# Patient Record
Sex: Female | Born: 1990 | Race: Black or African American | Hispanic: No | Smoking: Never smoker
Health system: Southern US, Community
[De-identification: ages and names within clinical notes are randomized; demographics above are authoritative.]

## PROBLEM LIST (undated history)

## (undated) DIAGNOSIS — E282 Polycystic ovarian syndrome: Secondary | ICD-10-CM

---

## 2014-10-28 ENCOUNTER — Encounter (HOSPITAL_BASED_OUTPATIENT_CLINIC_OR_DEPARTMENT_OTHER): Payer: Self-pay | Admitting: Emergency Medicine

## 2014-10-28 ENCOUNTER — Emergency Department (HOSPITAL_BASED_OUTPATIENT_CLINIC_OR_DEPARTMENT_OTHER)
Admission: EM | Admit: 2014-10-28 | Discharge: 2014-10-28 | Disposition: A | Discharge status: Home / Self Care | Payer: Managed Care, Other (non HMO) | Attending: Emergency Medicine | Admitting: Emergency Medicine

## 2014-10-28 DIAGNOSIS — K0889 Other specified disorders of teeth and supporting structures: Secondary | ICD-10-CM

## 2014-10-28 DIAGNOSIS — K088 Other specified disorders of teeth and supporting structures: Secondary | ICD-10-CM | POA: Insufficient documentation

## 2014-10-28 DIAGNOSIS — Z792 Long term (current) use of antibiotics: Secondary | ICD-10-CM | POA: Insufficient documentation

## 2014-10-28 DIAGNOSIS — K047 Periapical abscess without sinus: Secondary | ICD-10-CM | POA: Insufficient documentation

## 2014-10-28 MED ORDER — HYDROCODONE-ACETAMINOPHEN 5-325 MG PO TABS: 1.0000 | ORAL_TABLET | Freq: Four times a day (QID) | ORAL | Status: DC | PRN

## 2014-10-28 NOTE — ED Notes (Signed)
Patient states that she is on PCN and Motrin after seeing her Dentist for an "infected tooth". The patient states that the pain is so bad tonight that she thinks it needs to be "cut out"

## 2014-10-28 NOTE — ED Provider Notes (Signed)
CSN: [id number]     Arrival date & time 10/28/14  2258 History  This chart was scribed for Geoffery Lyons, MD by Bronson Curb, ED Scribe. This patient was seen in room MH02/MH02 and the patient's care was started at 11:37 PM.   Chief Complaint  Patient presents with  . Dental Pain    The history is provided by the patient. No language interpreter was used.     HPI Comments: Amber Bryan is a 24 y.o. female, with no significant past medical history, who presents to the Emergency Department complaining of intermittent, throbbing, right upper dental pain that began today. Patient was recently seen by her dentist and was informed she had an infection in her tooth. She has been taking penicillin and Motrin as prescribed without significant improvement. She is has also tried anesthetic gel without relief. She denies fever or drainage.   History reviewed. No pertinent past medical history. History reviewed. No pertinent past surgical history. History reviewed. No pertinent family history. History  Substance Use Topics  . Smoking status: Never Smoker   . Smokeless tobacco: Not on file  . Alcohol Use: No   OB History    No data available     Review of Systems  A complete 10 system review of systems was obtained and all systems are negative except as noted in the HPI and PMH.    Allergies  Review of patient's allergies indicates no known allergies.  Home Medications   Prior to Admission medications   Medication Sig Start Date End Date Taking? Authorizing Provider  ibuprofen (ADVIL,MOTRIN) 800 MG tablet Take 800 mg by mouth every 8 (eight) hours as needed.   Yes Historical Provider, MD  penicillin v potassium (VEETID) 250 MG tablet Take 250 mg by mouth 4 (four) times daily.   Yes Historical Provider, MD   Triage Vitals: BP 124/72 mmHg  Pulse 79  Temp(Src) 98.6 F (37 C) (Oral)  Resp 18  Wt 250 lb (113.399 kg)  SpO2 100%  LMP 09/15/2014 (Approximate)  Physical Exam   Constitutional: She is oriented to person, place, and time. She appears well-developed and well-nourished. No distress.  HENT:  Head: Normocephalic and atraumatic.  Eyes: Conjunctivae and EOM are normal.  Neck: Neck supple. No tracheal deviation present.  Cardiovascular: Normal rate.   Pulmonary/Chest: Effort normal. No respiratory distress.  Musculoskeletal: Normal range of motion.  Neurological: She is alert and oriented to person, place, and time.  Skin: Skin is warm and dry.  Psychiatric: She has a normal mood and affect. Her behavior is normal.  Nursing note and vitals reviewed.   ED Course  Procedures (including critical care time)  DIAGNOSTIC STUDIES: Oxygen Saturation is 100% on room air, normal by my interpretation.    COORDINATION OF CARE: At 2340 Discussed treatment plan with patient which includes pain medication and f/u with dentist. Patient agrees.   Labs Review Labs Reviewed - No data to display  Imaging Review No results found.   EKG Interpretation None      MDM   Final diagnoses:  None    Patient presents with complaints of dental pain. She is on penicillin and ibuprofen, however her pain worsened this evening. Nothing on her exam suggests an abscess or an emergent condition. I will give her pain medication and advised her to follow-up with her dentist on Monday.  I personally performed the services described in this documentation, which was scribed in my presence. The recorded information has been reviewed and  is accurate.      Geoffery Lyons, MD 10/29/14 [phone]

## 2014-10-28 NOTE — Discharge Instructions (Signed)
Continue penicillin as prescribed.  Hydrocodone as prescribed as needed for pain.  Follow-up with your dentist on Monday if not improving.   Dental Pain A tooth ache may be caused by cavities (tooth decay). Cavities expose the nerve of the tooth to air and hot or cold temperatures. It may come from an infection or abscess (also called a boil or furuncle) around your tooth. It is also often caused by dental caries (tooth decay). This causes the pain you are having. DIAGNOSIS  Your caregiver can diagnose this problem by exam. TREATMENT   If caused by an infection, it may be treated with medications which kill germs (antibiotics) and pain medications as prescribed by your caregiver. Take medications as directed.  Only take over-the-counter or prescription medicines for pain, discomfort, or fever as directed by your caregiver.  Whether the tooth ache today is caused by infection or dental disease, you should see your dentist as soon as possible for further care. SEEK MEDICAL CARE IF: The exam and treatment you received today has been provided on an emergency basis only. This is not a substitute for complete medical or dental care. If your problem worsens or new problems (symptoms) appear, and you are unable to meet with your dentist, call or return to this location. SEEK IMMEDIATE MEDICAL CARE IF:   You have a fever.  You develop redness and swelling of your face, jaw, or neck.  You are unable to open your mouth.  You have severe pain uncontrolled by pain medicine. MAKE SURE YOU:   Understand these instructions.  Will watch your condition.  Will get help right away if you are not doing well or get worse. Document Released: 04/29/2005 Document Revised: 07/22/2011 Document Reviewed: 12/16/2007 [facility] Patient Information 2015 [place], [place]. This information is not intended to replace advice given to you by your health care provider. Make sure you discuss any questions you have with  your health care provider.

## 2014-10-28 NOTE — ED Notes (Signed)
Rt upper tooth pain x 1 day

## 2016-08-19 ENCOUNTER — Encounter (HOSPITAL_COMMUNITY): Payer: Self-pay

## 2016-08-19 ENCOUNTER — Emergency Department (HOSPITAL_COMMUNITY)
Admission: EM | Admit: 2016-08-19 | Discharge: 2016-08-19 | Disposition: A | Discharge status: Home / Self Care | Payer: No Typology Code available for payment source | Attending: Emergency Medicine | Admitting: Emergency Medicine

## 2016-08-19 DIAGNOSIS — Z79899 Other long term (current) drug therapy: Secondary | ICD-10-CM | POA: Insufficient documentation

## 2016-08-19 DIAGNOSIS — R51 Headache: Secondary | ICD-10-CM | POA: Diagnosis present

## 2016-08-19 DIAGNOSIS — R42 Dizziness and giddiness: Secondary | ICD-10-CM

## 2016-08-19 DIAGNOSIS — G43909 Migraine, unspecified, not intractable, without status migrainosus: Secondary | ICD-10-CM | POA: Diagnosis not present

## 2016-08-19 LAB — CBC
HCT: 38.6 % (ref 36.0–46.0)
Hemoglobin: 12.9 g/dL (ref 12.0–15.0)
MCH: 29.9 pg (ref 26.0–34.0)
MCHC: 33.4 g/dL (ref 30.0–36.0)
MCV: 89.4 fL (ref 78.0–100.0)
PLATELETS: 355 10*3/uL (ref 150–400)
RBC: 4.32 MIL/uL (ref 3.87–5.11)
RDW: 13.2 % (ref 11.5–15.5)
WBC: 8.4 10*3/uL (ref 4.0–10.5)

## 2016-08-19 LAB — COMPREHENSIVE METABOLIC PANEL
ALT: 17 U/L (ref 14–54)
ANION GAP: 5 (ref 5–15)
AST: 19 U/L (ref 15–41)
Albumin: 4 g/dL (ref 3.5–5.0)
Alkaline Phosphatase: 78 U/L (ref 38–126)
BILIRUBIN TOTAL: 0.6 mg/dL (ref 0.3–1.2)
BUN: 13 mg/dL (ref 6–20)
CHLORIDE: 109 mmol/L (ref 101–111)
CO2: 26 mmol/L (ref 22–32)
Calcium: 9.8 mg/dL (ref 8.9–10.3)
Creatinine, Ser: 0.9 mg/dL (ref 0.44–1.00)
GFR calc Af Amer: >60 mL/min (ref >60)
GFR calc non Af Amer: >60 mL/min (ref >60)
GLUCOSE: 82 mg/dL (ref 65–99)
POTASSIUM: 4.3 mmol/L (ref 3.5–5.1)
Sodium: 140 mmol/L (ref 135–145)
Total Protein: 8.3 g/dL — ABNORMAL HIGH (ref 6.5–8.1)

## 2016-08-19 LAB — I-STAT BETA HCG BLOOD, ED (MC, WL, AP ONLY)

## 2016-08-19 LAB — URINALYSIS, ROUTINE W REFLEX MICROSCOPIC
Bilirubin Urine: NEGATIVE
GLUCOSE, UA: NEGATIVE mg/dL
Hgb urine dipstick: NEGATIVE
Ketones, ur: NEGATIVE mg/dL
Nitrite: NEGATIVE
PH: 6 (ref 5.0–8.0)
Protein, ur: NEGATIVE mg/dL
Specific Gravity, Urine: 1.023 (ref 1.005–1.030)

## 2016-08-19 MED ORDER — PROCHLORPERAZINE EDISYLATE 5 MG/ML IJ SOLN
10.0000 mg | Freq: Once | INTRAMUSCULAR | Status: AC
  Administered 2016-08-19: 10 mg via INTRAVENOUS
  Filled 2016-08-19: qty 2

## 2016-08-19 MED ORDER — DIPHENHYDRAMINE HCL 50 MG/ML IJ SOLN
25.0000 mg | Freq: Once | INTRAMUSCULAR | Status: AC
  Administered 2016-08-19: 25 mg via INTRAVENOUS
  Filled 2016-08-19: qty 1

## 2016-08-19 MED ORDER — MECLIZINE HCL 25 MG PO TABS: 25.0000 mg | ORAL_TABLET | Freq: Three times a day (TID) | ORAL | 0 refills | Status: DC | PRN

## 2016-08-19 MED ORDER — SODIUM CHLORIDE 0.9 % IV BOLUS (SEPSIS)
1000.0000 mL | Freq: Once | INTRAVENOUS | Status: AC
  Administered 2016-08-19: 1000 mL via INTRAVENOUS

## 2016-08-19 MED ORDER — KETOROLAC TROMETHAMINE 30 MG/ML IJ SOLN
30.0000 mg | Freq: Once | INTRAMUSCULAR | Status: AC
  Administered 2016-08-19: 30 mg via INTRAVENOUS
  Filled 2016-08-19: qty 1

## 2016-08-19 NOTE — Discharge Instructions (Signed)
Read the information below.  Use the prescribed medication as directed.  Please discuss all new medications with your pharmacist.  You may return to the Emergency Department at any time for worsening condition or any new symptoms that concern you.    °

## 2016-08-19 NOTE — ED Triage Notes (Signed)
PT C/O DIZZINESS X4 DAYS AGO, AND HEADACHE SINCE TODAY. PT HAD NAUSEA, BUT FOR ONLY 2 DAYS. DENIES VOMITING. SENSITIVITY TO LIGHT, AS WELL.

## 2016-08-19 NOTE — ED Provider Notes (Signed)
[facility]PT Provider Note   CSN: [id number] Arrival date & time: 08/19/16  1511     History   Chief Complaint Chief Complaint  Patient presents with  . Dizziness  . Headache    HPI Amber Bryan is a 26 y.o. female.  HPI   Patient presents with 4 days of dizziness, spinning and lightheadedness, that is intermittent, had nausea two days ago that came and went, headache today that is right side 6/10 intensity.  Pt states the headache has improved and is mild currently.  Denies fevers, chills, myalgias, head trauma, recent illness, neck stiffness.  No visual changes.  No focal neurologic deficits.  Has PCOS, doubts pregnancy.    History reviewed. No pertinent past medical history.  There are no active problems to display for this patient.   History reviewed. No pertinent surgical history.  OB History    No data available       Home Medications    Prior to Admission medications   Medication Sig Start Date End Date Taking? Authorizing Provider  medroxyPROGESTERone (PROVERA) 10 MG tablet Take 10 mg by mouth daily.   Yes Historical Provider, MD  metFORMIN (GLUCOPHAGE) 500 MG tablet Take 500 mg by mouth 2 (two) times daily with a meal.   Yes Historical Provider, MD  meclizine (ANTIVERT) 25 MG tablet Take 1 tablet (25 mg total) by mouth 3 (three) times daily as needed for dizziness. 08/19/16   Trixie Dredge, PA-C    Family History History reviewed. No pertinent family history.  Social History Social History  Substance Use Topics  . Smoking status: Never Smoker  . Smokeless tobacco: Never Used  . Alcohol use No     Allergies   Patient has no known allergies.   Review of Systems Review of Systems  All other systems reviewed and are negative.    Physical Exam Updated Vital Signs BP 120/68   Pulse 80   Temp 99.2 F (37.3 C) (Oral)   Resp 16   Ht  (1.676 m)   Wt (!) 149.7 kg   LMP 05/28/2016   SpO2 96%   BMI 53.26 kg/m   Physical Exam    Constitutional: She appears well-developed and well-nourished. No distress.  HENT:  Head: Normocephalic and atraumatic.  Neck: Neck supple.  Cardiovascular: Normal rate and regular rhythm.   Pulmonary/Chest: Effort normal and breath sounds normal. No respiratory distress. She has no wheezes. She has no rales.  Abdominal: Soft. She exhibits no distension. There is no tenderness. There is no rebound and no guarding.  Neurological: She is alert.  CN II-XII intact, EOMs intact, no pronator drift, grip strengths equal bilaterally; strength 5/5 in all extremities, sensation intact in all extremities; finger to nose, heel to shin, rapid alternating movements normal; gait is normal.     Skin: She is not diaphoretic.  Nursing note and vitals reviewed.    ED Treatments / Results  Labs (all labs ordered are listed, but only abnormal results are displayed) Labs Reviewed  URINALYSIS, ROUTINE W REFLEX MICROSCOPIC - Abnormal; Notable for the following:       Result Value   APPearance HAZY (*)    Leukocytes, UA MODERATE (*)    Bacteria, UA RARE (*)    Squamous Epithelial / LPF 6-30 (*)    All other components within normal limits  COMPREHENSIVE METABOLIC PANEL - Abnormal; Notable for the following:    Total Protein 8.3 (*)    All other components within normal limits  CBC  I-STAT BETA HCG BLOOD, ED ([facility], [facility], [facility] ONLY)    EKG  EKG Interpretation None       Radiology No results found.  Procedures Procedures (including critical care time)  Medications Ordered in ED Medications  ketorolac (TORADOL) 30 MG/ML injection 30 mg (30 mg Intravenous Given 08/19/16 1720)  prochlorperazine (COMPAZINE) injection 10 mg (10 mg Intravenous Given 08/19/16 1720)  diphenhydrAMINE (BENADRYL) injection 25 mg (25 mg Intravenous Given 08/19/16 1720)  sodium chloride 0.9 % bolus 1,000 mL (0 mLs Intravenous Stopped 08/19/16 1809)     Initial Impression / Assessment and Plan / ED Course  I have reviewed the  triage vital signs and the nursing notes.  Pertinent labs & imaging results that were available during my care of the patient were reviewed by me and considered in my medical decision making (see chart for details).     Afebrile, nontoxic patient with 4 days of intermittent dizziness (spinning, lightheadedness), asymptomatic from this while in the ED, right sided headache that resolved with migraine cocktail.  Exam and workup unremarkable.    D/C home with PCP follow up.  Discussed result, findings, treatment, and follow up  with patient.  Pt given return precautions.  Pt verbalizes understanding and agrees with plan.       Final Clinical Impressions(s) / ED Diagnoses   Final diagnoses:  Dizziness  Migraine without status migrainosus, not intractable, unspecified migraine type    New Prescriptions Discharge Medication List as of 08/19/2016  6:04 PM    START taking these medications   Details  meclizine (ANTIVERT) 25 MG tablet Take 1 tablet (25 mg total) by mouth 3 (three) times daily as needed for dizziness., Starting Mon 08/19/2016, Print         [place], PA-C 08/19/16 2242    Shaune Pollack, MD 08/21/16 [phone]

## 2016-10-14 ENCOUNTER — Emergency Department (HOSPITAL_COMMUNITY)
Admission: EM | Admit: 2016-10-14 | Discharge: 2016-10-14 | Disposition: A | Discharge status: Home / Self Care | Payer: PRIVATE HEALTH INSURANCE | Attending: Emergency Medicine | Admitting: Emergency Medicine

## 2016-10-14 DIAGNOSIS — Z7984 Long term (current) use of oral hypoglycemic drugs: Secondary | ICD-10-CM | POA: Insufficient documentation

## 2016-10-14 DIAGNOSIS — M79672 Pain in left foot: Secondary | ICD-10-CM | POA: Diagnosis present

## 2016-10-14 DIAGNOSIS — M7662 Achilles tendinitis, left leg: Secondary | ICD-10-CM | POA: Diagnosis not present

## 2016-10-14 DIAGNOSIS — Z79899 Other long term (current) drug therapy: Secondary | ICD-10-CM | POA: Diagnosis not present

## 2016-10-14 MED ORDER — NAPROXEN 500 MG PO TABS: 500.0000 mg | ORAL_TABLET | Freq: Two times a day (BID) | ORAL | 0 refills | Status: AC

## 2016-10-14 NOTE — Discharge Instructions (Signed)
Wear supportive shoes with a good arch support. Use a frozen water bottle to rub along the calf and over the area of pain, in order to stretch and ice the area to help with pain. Use naprosyn as directed to help with inflammation and pain, and use tylenol for additional pain relief. May consider use of heat as well to help with pain. Follow up with the podiatrist in 1 week for recheck of symptoms and ongoing management of your heel pain. Return to the ER for changes or worsening symptoms.

## 2016-10-14 NOTE — ED Provider Notes (Signed)
[facility] Provider Note   CSN: [id number] Arrival date & time: 10/14/16  1731  By signing my name below, I, Amber Bryan, attest that this documentation has been prepared under the direction and in the presence of  [address]Andretta Ergle, [employer]F CorporationPA-C. Electronically Signed: Cynda AcresHailei Bryan, Scribe. 10/14/16. 6:04 PM.  History   Chief Complaint Chief Complaint  Patient presents with  . Foot Pain    HPI Comments: Amber Bryan is a 26 y.o. female with no past medical history, who presents to the Emergency Department complaining of sudden-onset, constant posterior left foot/ankle/heel pain that happened once one week ago, but then resolved and then 3 days ago it started again and hasn't stopped. Patient describes her pain as 8/10 constant sore/achy and occasionally sharp non-radiating left heel pain; Patient reports applying warm compresses with no relief, ambulating/standing aggravates the pain. Patient is ambulatory in the emergency department. Patient denies any injury/twists/falls. She also denies any bruising, swelling, redness, warmth, numbness, tingling, focal weakness, or any other complaints at this time.    The history is provided by the patient and medical records. No language interpreter was used.  Ankle Pain   The incident occurred more than 2 days ago. The incident occurred at home. There was no injury mechanism. The pain is present in the left foot and left ankle. The quality of the pain is described as sharp and aching. The pain is at a severity of 8/10. The pain is moderate. The pain has been constant since onset. Pertinent negatives include no numbness, no loss of motion, no muscle weakness, no loss of sensation and no tingling. She reports no foreign bodies present. The symptoms are aggravated by bearing weight. She has tried heat for the symptoms. The treatment provided no relief.    No past medical history on file.  There are no active problems to display for this  patient.   No past surgical history on file.  OB History    No data available       Home Medications    Prior to Admission medications   Medication Sig Start Date End Date Taking? Authorizing Provider  meclizine (ANTIVERT) 25 MG tablet Take 1 tablet (25 mg total) by mouth 3 (three) times daily as needed for dizziness. 08/19/16   Trixie DredgeWest, Emily, PA-C  medroxyPROGESTERone (PROVERA) 10 MG tablet Take 10 mg by mouth daily.    [provider]  metFORMIN (GLUCOPHAGE) 500 MG tablet Take 500 mg by mouth 2 (two) times daily with a meal.    [provider]    Family History No family history on file.  Social History Social History  Substance Use Topics  . Smoking status: Never Smoker  . Smokeless tobacco: Never Used  . Alcohol use No     Allergies   Patient has no known allergies.   Review of Systems Review of Systems  Musculoskeletal: Positive for arthralgias. Negative for joint swelling.  Skin: Negative for color change.  Allergic/Immunologic: Negative for immunocompromised state.  Neurological: Negative for tingling, weakness and numbness.  Psychiatric/Behavioral: Negative for confusion.     Physical Exam Updated Vital Signs BP 137/79 (BP Location: Right Arm)   Pulse 98   Temp 99.4 F (37.4 C) (Oral)   Resp 18   SpO2 95%   Physical Exam  Constitutional: She is oriented to person, place, and time. Vital signs are normal. She appears well-developed and well-nourished.  Non-toxic appearance. No distress.  Afebrile, nontoxic, NAD  HENT:  Head: Normocephalic and atraumatic.  Mouth/Throat: Mucous membranes are normal.  Eyes: Conjunctivae and EOM are normal. Right eye exhibits no discharge. Left eye exhibits no discharge.  Neck: Normal range of motion. Neck supple.  Cardiovascular: Normal rate and intact distal pulses.   Pulmonary/Chest: Effort normal. No respiratory distress.  Abdominal: Normal appearance. She exhibits no distension.  Musculoskeletal:  Normal range of motion.       Left ankle: She exhibits normal range of motion, no swelling, no ecchymosis, no deformity, no laceration and normal pulse. Tenderness. Achilles tendon normal.  Left ankle with FROM intact, no swelling, no crepitus or deformity, with mild TTP of the achilles tendon insertion site at the posterior heel, but no TTP or swelling of fore foot, calf, or remainder of the ankle. No break in skin. No bruising or erythema. No warmth. Achilles intact. Good pedal pulse and cap refill of all toes. Wiggling toes without difficulty. Sensation grossly intact. Soft compartments  Neurological: She is alert and oriented to person, place, and time. She has normal strength. No sensory deficit.  Skin: Skin is warm, dry and intact. No rash noted.  Psychiatric: She has a normal mood and affect. Her behavior is normal.  Nursing note and vitals reviewed.    ED Treatments / Results  DIAGNOSTIC STUDIES: Oxygen Saturation is 95% on RA, adequate by my interpretation.    COORDINATION OF CARE: 6:03 PM Discussed treatment plan with pt at bedside and pt agreed to plan, which includes an anti-inflammatory pain medication.   Labs (all labs ordered are listed, but only abnormal results are displayed) Labs Reviewed - No data to display  EKG  EKG Interpretation None       Radiology No results found.  Procedures Procedures (including critical care time)  Medications Ordered in ED Medications - No data to display   Initial Impression / Assessment and Plan / ED Course  I have reviewed the triage vital signs and the nursing notes.  Pertinent labs & imaging results that were available during my care of the patient were reviewed by me and considered in my medical decision making (see chart for details).     26 y.o. female here with L heel pain x3 days, no trauma or injury. On exam, tenderness to achilles insertion site, NVI with soft compartments, FROM intact. Likely achilles tendinitis.  Advised frozen water bottle stretching/icing to calf musculature and over the area; heat use also advised. Will rx naprosyn x1wk scheduled BID; advised use of tylenol as well. F/up with podiatry in 1wk for recheck and ongoing management. Doubt need for further emergent work up/imaging at this time. I explained the diagnosis and have given explicit precautions to return to the ER including for any other new or worsening symptoms. The patient understands and accepts the medical plan as it's been dictated and I have answered their questions. Discharge instructions concerning home care and prescriptions have been given. The patient is STABLE and is discharged to home in good condition.   I personally performed the services described in this documentation, which was scribed in my presence. The recorded information has been reviewed and is accurate.   Final Clinical Impressions(s) / ED Diagnoses   Final diagnoses:  Achilles tendinitis of left lower extremity    New Prescriptions New Prescriptions   NAPROXEN (NAPROSYN) 500 MG TABLET    Take 1 tablet (500 mg total) by mouth 2 (two) times daily with a meal.     [address], [place], [place] 10/14/16 1822    Little, Ambrose Finland, MD  10/16/16 0700  

## 2016-10-14 NOTE — ED Triage Notes (Signed)
Pt complaining of left foot/ankle pain. States pain to touch, unbearable to walk on.

## 2016-11-28 ENCOUNTER — Encounter (HOSPITAL_COMMUNITY): Payer: Self-pay

## 2016-11-28 ENCOUNTER — Ambulatory Visit (HOSPITAL_COMMUNITY)
Admission: EM | Admit: 2016-11-28 | Discharge: 2016-11-28 | Disposition: A | Discharge status: Home / Self Care | Payer: No Typology Code available for payment source | Attending: Family Medicine | Admitting: Family Medicine

## 2016-11-28 DIAGNOSIS — L500 Allergic urticaria: Secondary | ICD-10-CM | POA: Diagnosis not present

## 2016-11-28 MED ORDER — PREDNISONE 10 MG (21) PO TBPK: ORAL_TABLET | Freq: Every day | ORAL | 0 refills | Status: DC

## 2016-11-28 NOTE — ED Triage Notes (Signed)
Pt thinks she may have a insect bite. It's located on her left leg and right arm. Happened on Tuesday and now having swelling, pain and itching. Unsure what bit her. Low grade fever today. Did take benadryl and it didn't help.

## 2016-11-29 NOTE — ED Provider Notes (Addendum)
  [facility]   [id number] 11/28/16 Arrival Time: 1925  ASSESSMENT & PLAN:  1. Allergic urticaria    Meds ordered this encounter  Medications  . predniSONE (STERAPRED UNI-PAK 21 TAB) 10 MG (21) TBPK tablet    Sig: Take by mouth daily. Take as directed.    Dispense:  21 tablet    Refill:  0   Benadryl if needed.  Reviewed expectations re: course of current medical issues. Questions answered. Outlined signs and symptoms indicating need for more acute intervention. Patient verbalized understanding. After Visit Summary given.   SUBJECTIVE:  Amber Bryan is a 26 y.o. female who presents with complaint of possible insect bites. Left leg and right arm. Itching for 2 days. Benadryl without much relief. No extremity swelling. "Felt feverish". No n/v.  ROS: As per HPI.   OBJECTIVE:  Vitals:   11/28/16 2001 11/28/16 2002  BP:  116/65  Pulse: 97   Resp: 18   Temp: 99.2 F (37.3 C)   TempSrc: Oral   SpO2: 99%      General appearance: alert; no distress Extremities: no cyanosis or edema; symmetrical with no gross deformities Skin: warm and dry; several 4-5cm circular wheals on arms and legs  No Known Allergies  PMHx, SurgHx, SocialHx, Medications, and Allergies were reviewed in the Visit Navigator and updated as appropriate.      Mardella LaymanHagler, Lovelle Deitrick, MD 11/29/16 1306    Mardella LaymanHagler, Roise Emert, MD 11/29/16 1308

## 2017-06-21 ENCOUNTER — Ambulatory Visit: Payer: Self-pay | Admitting: Family Medicine

## 2017-10-30 ENCOUNTER — Encounter (HOSPITAL_COMMUNITY): Payer: Self-pay

## 2017-10-30 ENCOUNTER — Other Ambulatory Visit: Payer: Self-pay

## 2017-10-30 ENCOUNTER — Emergency Department (HOSPITAL_COMMUNITY)
Admission: EM | Admit: 2017-10-30 | Discharge: 2017-10-31 | Disposition: A | Discharge status: Home / Self Care | Payer: No Typology Code available for payment source | Attending: Emergency Medicine | Admitting: Emergency Medicine

## 2017-10-30 ENCOUNTER — Emergency Department (HOSPITAL_COMMUNITY): Payer: No Typology Code available for payment source

## 2017-10-30 DIAGNOSIS — M79602 Pain in left arm: Secondary | ICD-10-CM

## 2017-10-30 DIAGNOSIS — M79601 Pain in right arm: Secondary | ICD-10-CM

## 2017-10-30 DIAGNOSIS — Z7984 Long term (current) use of oral hypoglycemic drugs: Secondary | ICD-10-CM | POA: Diagnosis not present

## 2017-10-30 DIAGNOSIS — Y92199 Unspecified place in other specified residential institution as the place of occurrence of the external cause: Secondary | ICD-10-CM | POA: Diagnosis not present

## 2017-10-30 DIAGNOSIS — S59912A Unspecified injury of left forearm, initial encounter: Secondary | ICD-10-CM | POA: Diagnosis present

## 2017-10-30 DIAGNOSIS — M79632 Pain in left forearm: Secondary | ICD-10-CM | POA: Insufficient documentation

## 2017-10-30 DIAGNOSIS — Z793 Long term (current) use of hormonal contraceptives: Secondary | ICD-10-CM | POA: Diagnosis not present

## 2017-10-30 DIAGNOSIS — M79631 Pain in right forearm: Secondary | ICD-10-CM | POA: Diagnosis not present

## 2017-10-30 DIAGNOSIS — Y9389 Activity, other specified: Secondary | ICD-10-CM | POA: Diagnosis not present

## 2017-10-30 DIAGNOSIS — Y990 Civilian activity done for income or pay: Secondary | ICD-10-CM | POA: Diagnosis not present
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
## 2017-10-31 NOTE — Discharge Instructions (Signed)
X-rays looked great. Can take tylenol or motrin for pain. Follow-up with your primary care doctor. Return here for any new/acute changes.

## 2018-06-16 ENCOUNTER — Emergency Department (HOSPITAL_COMMUNITY)
Admission: EM | Admit: 2018-06-16 | Discharge: 2018-06-16 | Disposition: A | Discharge status: Home / Self Care | Payer: 59 | Attending: Emergency Medicine | Admitting: Emergency Medicine

## 2018-06-16 ENCOUNTER — Other Ambulatory Visit: Payer: Self-pay

## 2018-06-16 ENCOUNTER — Encounter (HOSPITAL_COMMUNITY): Payer: Self-pay

## 2018-06-16 DIAGNOSIS — R300 Dysuria: Secondary | ICD-10-CM | POA: Diagnosis present

## 2018-06-16 DIAGNOSIS — Z7984 Long term (current) use of oral hypoglycemic drugs: Secondary | ICD-10-CM | POA: Diagnosis not present

## 2018-06-16 DIAGNOSIS — Z79899 Other long term (current) drug therapy: Secondary | ICD-10-CM | POA: Diagnosis not present

## 2018-06-16 DIAGNOSIS — N390 Urinary tract infection, site not specified: Secondary | ICD-10-CM | POA: Diagnosis not present

## 2018-06-16 HISTORY — DX: Polycystic ovarian syndrome: E28.2

## 2018-06-16 LAB — URINALYSIS, ROUTINE W REFLEX MICROSCOPIC
BILIRUBIN URINE: NEGATIVE
GLUCOSE, UA: NEGATIVE mg/dL
Ketones, ur: NEGATIVE mg/dL
NITRITE: NEGATIVE
Protein, ur: >=300 mg/dL — AB
SPECIFIC GRAVITY, URINE: 1.02 (ref 1.005–1.030)
Squamous Epithelial / LPF: >50 — ABNORMAL HIGH (ref 0–5)
WBC, UA: >50 WBC/hpf — ABNORMAL HIGH (ref 0–5)
pH: 5 (ref 5.0–8.0)

## 2018-06-16 LAB — POC URINE PREG, ED: Preg Test, Ur: NEGATIVE

## 2018-06-16 MED ORDER — CEPHALEXIN 500 MG PO CAPS: 500.0000 mg | ORAL_CAPSULE | Freq: Two times a day (BID) | ORAL | 0 refills | Status: AC

## 2018-06-16 NOTE — ED Provider Notes (Signed)
[facility]-EMERGENCY DEPT Provider Note   CSN: [id number] Arrival date & time: 06/16/18  1814     History   Chief Complaint Chief Complaint  Patient presents with  . Dysuria  . Urinary Frequency    HPI Amber Bryan is a 28 y.o. female.  The history is provided by the patient.  Dysuria  Pain quality:  Burning Pain severity:  Mild Onset quality:  Gradual Timing:  Constant Progression:  Unchanged Chronicity:  Recurrent Recent urinary tract infections: no   Relieved by:  Nothing Urinary symptoms: frequent urination   Urinary symptoms: no discolored urine, no foul-smelling urine, no hematuria, no hesitancy and no bladder incontinence   Associated symptoms: no abdominal pain, no fever, no flank pain, no genital lesions, no nausea, no vaginal discharge and no vomiting   Risk factors: not pregnant   Urinary Frequency  Pertinent negatives include no abdominal pain.    Past Medical History:  Diagnosis Date  . PCOS (polycystic ovarian syndrome)     There are no active problems to display for this patient.   History reviewed. No pertinent surgical history.   OB History   No obstetric history on file.      Home Medications    Prior to Admission medications   Medication Sig Start Date End Date Taking? Authorizing Provider  cephALEXin (KEFLEX) 500 MG capsule Take 1 capsule (500 mg total) by mouth 2 (two) times daily for 5 days. 06/16/18 06/21/18  Velora Horstman, DO  meclizine (ANTIVERT) 25 MG tablet Take 1 tablet (25 mg total) by mouth 3 (three) times daily as needed for dizziness. 08/19/16   Trixie DredgeWest, Emily, PA-C  medroxyPROGESTERone (PROVERA) 10 MG tablet Take 10 mg by mouth daily.    [provider]  metFORMIN (GLUCOPHAGE) 500 MG tablet Take 500 mg by mouth 2 (two) times daily with a meal.    [provider]  predniSONE (STERAPRED UNI-PAK 21 TAB) 10 MG (21) TBPK tablet Take by mouth daily. Take as directed. 11/28/16   Mardella LaymanHagler, Brian, MD     Family History Family History  Problem Relation Age of Onset  . Diabetes Mother   . Hypertension Mother   . Diabetes Father   . Hypertension Father     Social History Social History   Tobacco Use  . Smoking status: Never Smoker  . Smokeless tobacco: Never Used  Substance Use Topics  . Alcohol use: No  . Drug use: No     Allergies   Patient has no known allergies.   Review of Systems Review of Systems  Constitutional: Negative for fever.  Gastrointestinal: Negative for abdominal distention, abdominal pain, constipation, diarrhea, nausea and vomiting.  Genitourinary: Positive for dysuria and frequency. Negative for decreased urine volume, difficulty urinating, dyspareunia, flank pain, genital sores, hematuria, menstrual problem, pelvic pain, urgency, vaginal bleeding, vaginal discharge and vaginal pain.     Physical Exam Updated Vital Signs BP (!) 141/95 (BP Location: Right Arm)   Pulse 80   Temp 98.8 F (37.1 C) (Oral)   Resp 16   Ht 5\' 6"  (1.676 m)   Wt (!) 154.2 kg   SpO2 98%   BMI 54.88 kg/m   Physical Exam HENT:     Head: Normocephalic and atraumatic.     Mouth/Throat:     Mouth: Mucous membranes are moist.  Cardiovascular:     Rate and Rhythm: Normal rate and regular rhythm.     Pulses: Normal pulses.     Heart sounds: Normal  heart sounds.  Abdominal:     General: Abdomen is flat. There is no distension.     Palpations: There is no mass.     Tenderness: There is no abdominal tenderness. There is no right CVA tenderness, left CVA tenderness, guarding or rebound.      ED Treatments / Results  Labs (all labs ordered are listed, but only abnormal results are displayed) Labs Reviewed  URINALYSIS, ROUTINE W REFLEX MICROSCOPIC - Abnormal; Notable for the following components:      Result Value   APPearance TURBID (*)    Hgb urine dipstick MODERATE (*)    Protein, ur >=300 (*)    Leukocytes, UA LARGE (*)    RBC / HPF >50 (*)    WBC, UA >50  (*)    Bacteria, UA FEW (*)    Squamous Epithelial / LPF >50 (*)    All other components within normal limits  POC URINE PREG, ED    EKG None  Radiology No results found.  Procedures Procedures (including critical care time)  Medications Ordered in ED Medications - No data to display   Initial Impression / Assessment and Plan / ED Course  I have reviewed the triage vital signs and the nursing notes.  Pertinent labs & imaging results that were available during my care of the patient were reviewed by me and considered in my medical decision making (see chart for details).     Amber Bryan is a 28 year old female with no significant medical history who presents to the ED with urinary symptoms.  Patient with normal vitals.  No fever.  Symptoms for the last day.  Has a history of urinary tract infections.  Pregnancy test was negative in triage.  Urinalysis positive for infection.  Will treat with Keflex. Patient has no abdominal tenderness, no CVA tenderness, no nausea, no vomiting.  No concern for pyelonephritis or other intra-abdominal process including appendicitis. Denies vaginal discharge/pain. No concern for STD. Patient is overall well-appearing.  Was discharged home in good condition and recommend follow-up with primary care doctor.  Given return precautions.  This chart was dictated using voice recognition software.  Despite best efforts to proofread,  errors can occur which can change the documentation meaning.    Final Clinical Impressions(s) / ED Diagnoses   Final diagnoses:  Lower urinary tract infectious disease    ED Discharge Orders         Ordered    cephALEXin (KEFLEX) 500 MG capsule  2 times daily     06/16/18 2011           Virgina Norfolk, DO 06/16/18 2011

## 2018-06-16 NOTE — ED Triage Notes (Signed)
Patient c/o dysuria and urinary frequency since yesterday. 

## 2019-02-13 IMAGING — CR DG HAND COMPLETE 3+V*L*
3 series · 3 of 3 positions shown · non-contrast
Comparison: None.

CLINICAL DATA: Left hand pain second through fifth metacarpal area

EXAM:
LEFT HAND - COMPLETE 3+ VIEW

[x hand pa left]
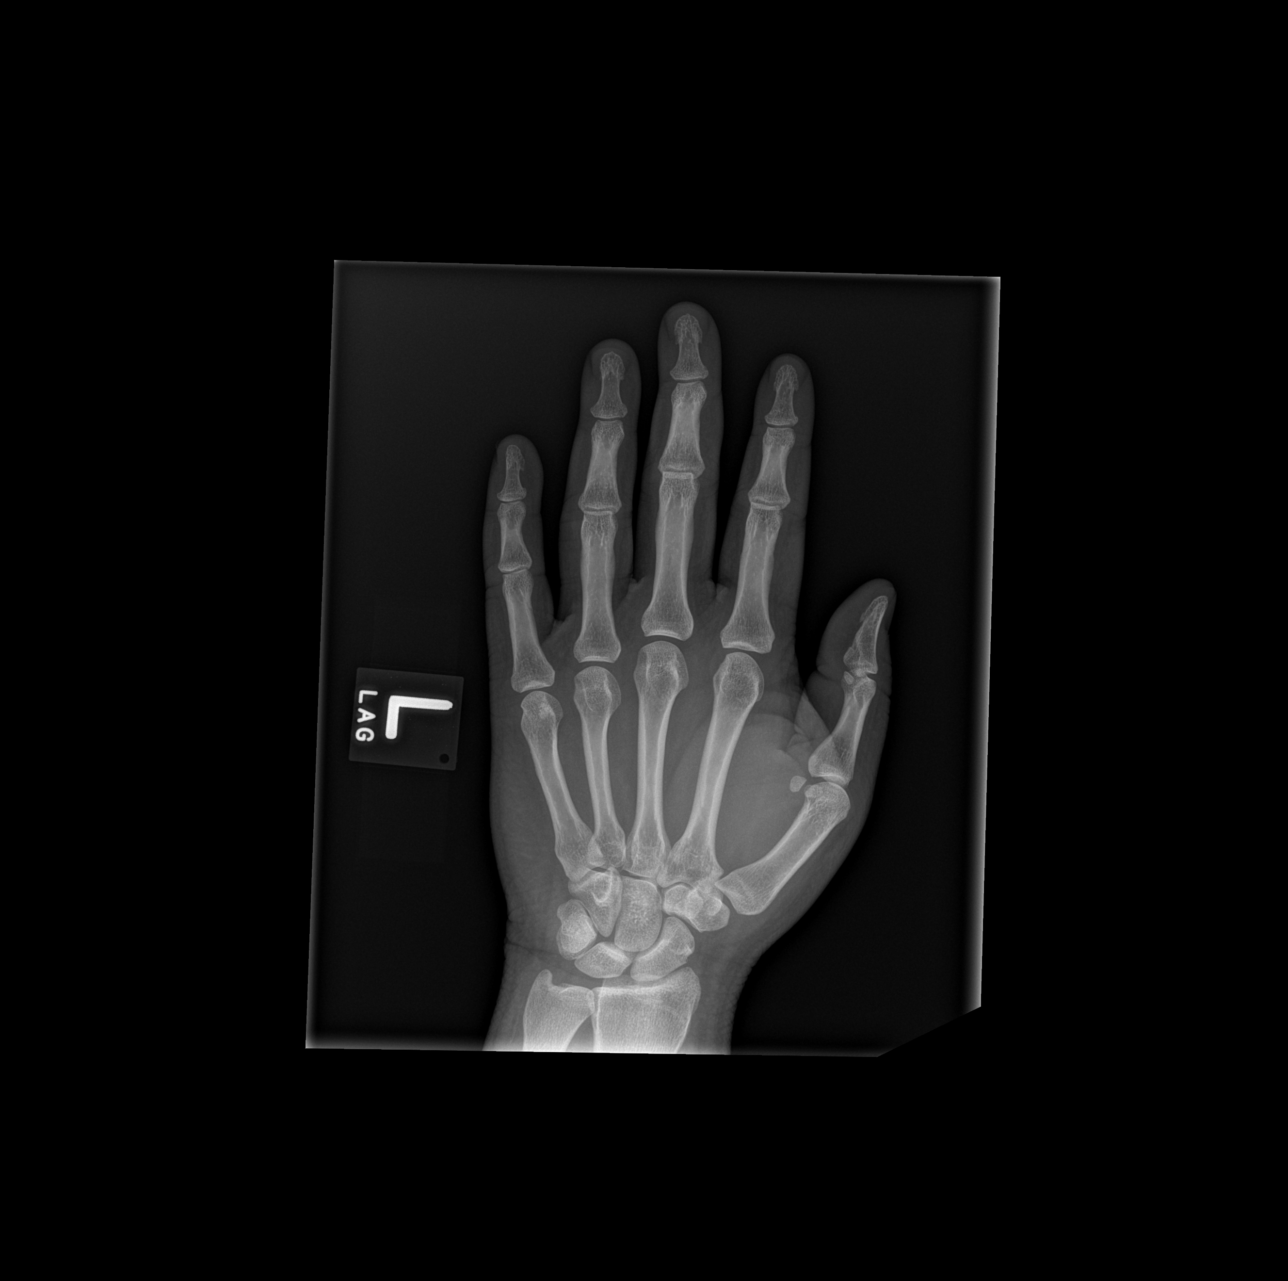

[x hand obl left]
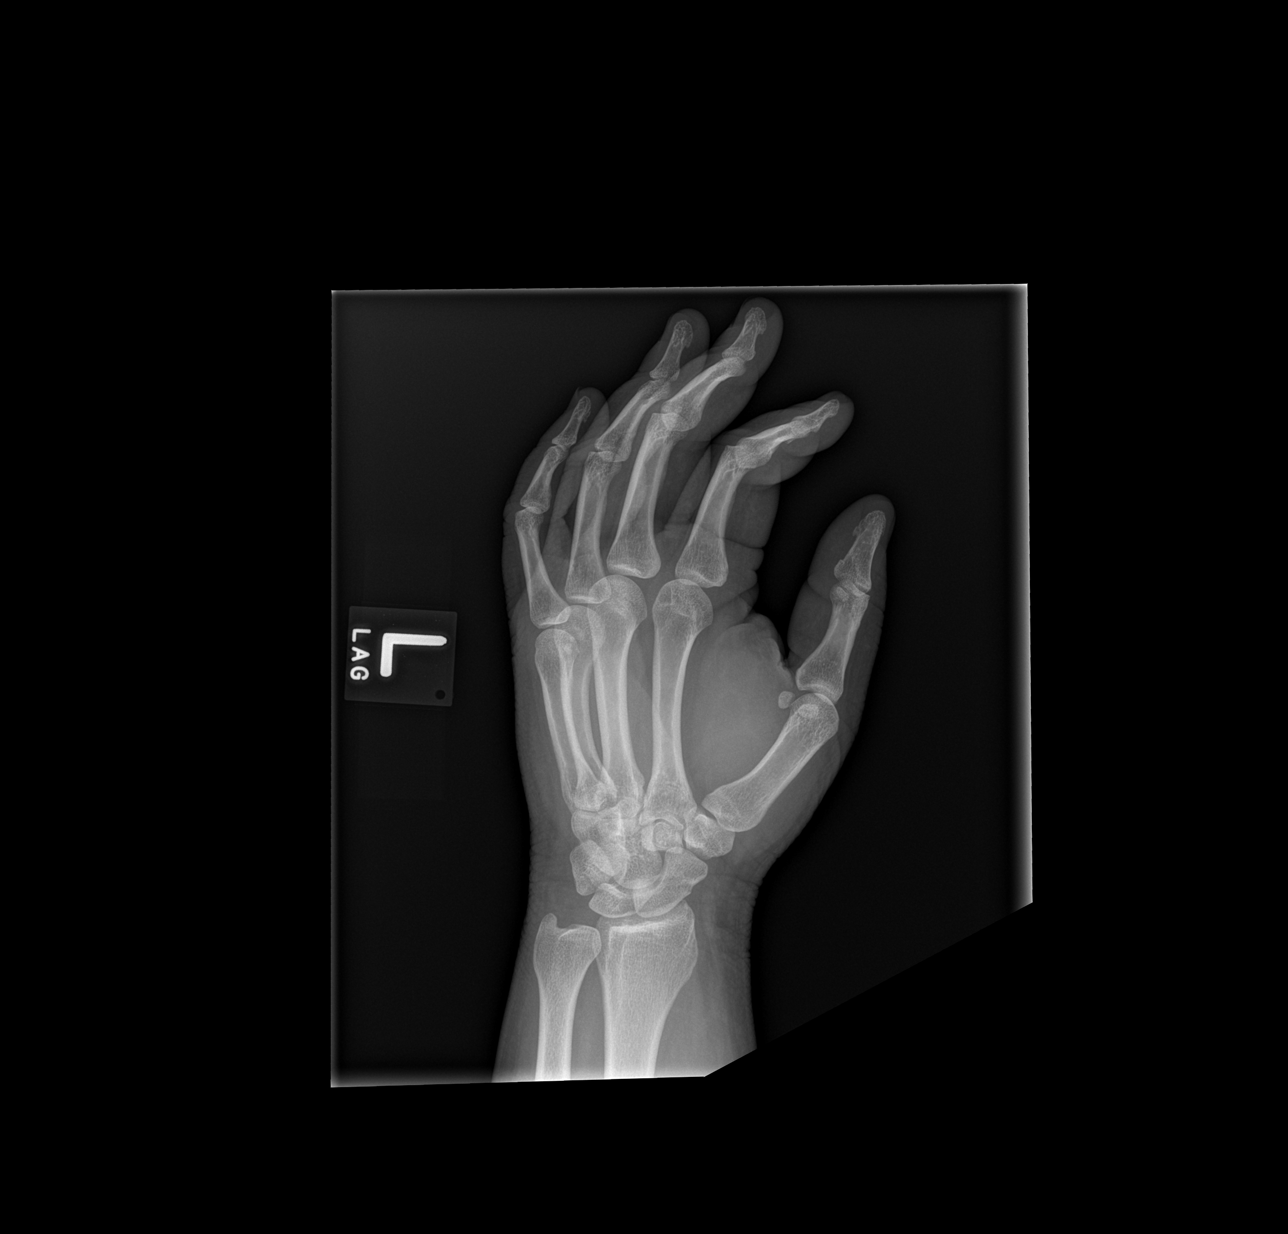

[x hand lat left]
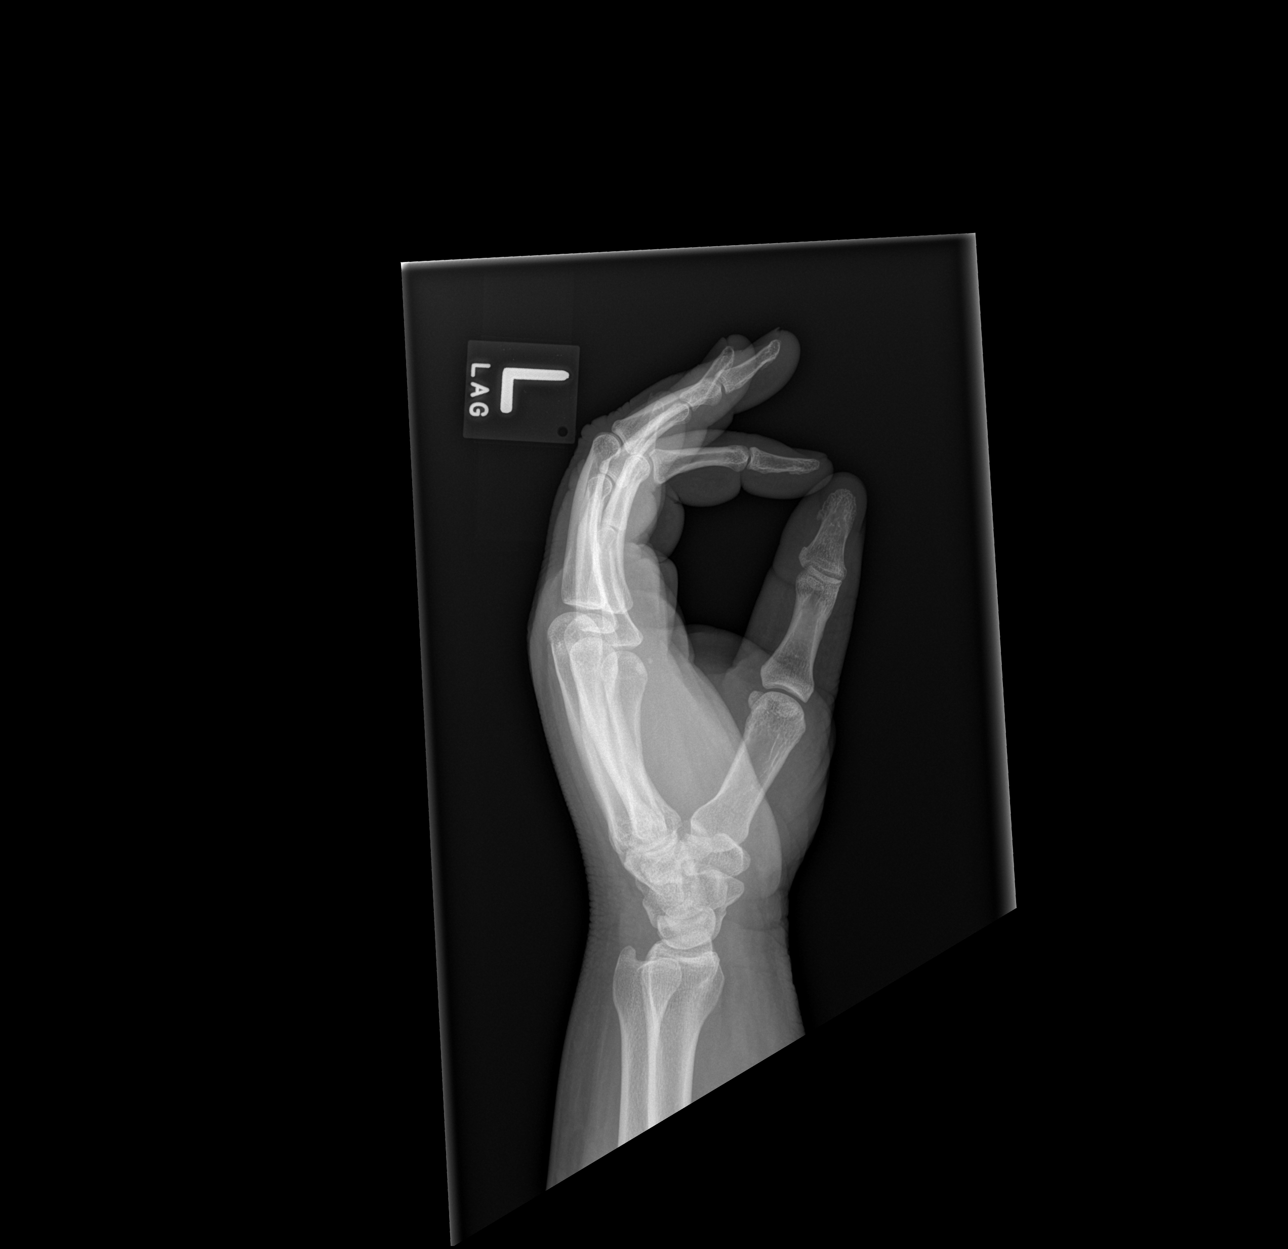

[3 of 3 positions shown; findings below may reference images not displayed]

FINDINGS: There is no evidence of fracture or dislocation. There is no
evidence of arthropathy or other focal bone abnormality. Soft
tissues are unremarkable.
IMPRESSION: Negative.

## 2019-08-03 ENCOUNTER — Emergency Department (HOSPITAL_COMMUNITY): Payer: 59

## 2019-08-03 ENCOUNTER — Emergency Department (HOSPITAL_COMMUNITY)
Admission: EM | Admit: 2019-08-03 | Discharge: 2019-08-03 | Disposition: A | Discharge status: Home / Self Care | Payer: 59 | Attending: Emergency Medicine | Admitting: Emergency Medicine

## 2019-08-03 ENCOUNTER — Encounter (HOSPITAL_COMMUNITY): Payer: Self-pay

## 2019-08-03 ENCOUNTER — Other Ambulatory Visit: Payer: Self-pay

## 2019-08-03 DIAGNOSIS — R0789 Other chest pain: Secondary | ICD-10-CM | POA: Diagnosis not present

## 2019-08-03 DIAGNOSIS — R0602 Shortness of breath: Secondary | ICD-10-CM | POA: Diagnosis not present

## 2019-08-03 LAB — TROPONIN I (HIGH SENSITIVITY)
Troponin I (High Sensitivity): 3 ng/L (ref <18)
Troponin I (High Sensitivity): 4 ng/L (ref <18)

## 2019-08-03 LAB — CBC
HCT: 37.7 % (ref 36.0–46.0)
Hemoglobin: 11.4 g/dL — ABNORMAL LOW (ref 12.0–15.0)
MCH: 25.9 pg — ABNORMAL LOW (ref 26.0–34.0)
MCHC: 30.2 g/dL (ref 30.0–36.0)
MCV: 85.5 fL (ref 80.0–100.0)
Platelets: 361 10*3/uL (ref 150–400)
RBC: 4.41 MIL/uL (ref 3.87–5.11)
RDW: 14.8 % (ref 11.5–15.5)
WBC: 8 10*3/uL (ref 4.0–10.5)
nRBC: 0 % (ref 0.0–0.2)

## 2019-08-03 LAB — BASIC METABOLIC PANEL
Anion gap: 8 (ref 5–15)
BUN: 10 mg/dL (ref 6–20)
CO2: 28 mmol/L (ref 22–32)
Calcium: 9.2 mg/dL (ref 8.9–10.3)
Chloride: 106 mmol/L (ref 98–111)
Creatinine, Ser: 0.88 mg/dL (ref 0.44–1.00)
GFR calc Af Amer: >60 mL/min (ref >60)
GFR calc non Af Amer: >60 mL/min (ref >60)
Glucose, Bld: 103 mg/dL — ABNORMAL HIGH (ref 70–99)
Potassium: 4 mmol/L (ref 3.5–5.1)
Sodium: 142 mmol/L (ref 135–145)

## 2019-08-03 LAB — I-STAT BETA HCG BLOOD, ED (NOT ORDERABLE): I-stat hCG, quantitative: <5 m[IU]/mL (ref <5)

## 2019-08-03 MED ORDER — KETOROLAC TROMETHAMINE 15 MG/ML IJ SOLN
15.0000 mg | Freq: Once | INTRAMUSCULAR | Status: AC
  Administered 2019-08-03: 18:00:00 15 mg via INTRAVENOUS
  Filled 2019-08-03: qty 1

## 2019-08-03 MED ORDER — SODIUM CHLORIDE 0.9% FLUSH: 3.0000 mL | Freq: Once | INTRAVENOUS | Status: DC

## 2019-08-03 MED ORDER — PROCHLORPERAZINE EDISYLATE 10 MG/2ML IJ SOLN
10.0000 mg | Freq: Once | INTRAMUSCULAR | Status: AC
  Administered 2019-08-03: 18:00:00 10 mg via INTRAVENOUS
  Filled 2019-08-03: qty 2

## 2019-08-03 MED ORDER — SODIUM CHLORIDE 0.9 % IV BOLUS
1000.0000 mL | Freq: Once | INTRAVENOUS | Status: AC
  Administered 2019-08-03: 1000 mL via INTRAVENOUS

## 2019-08-03 NOTE — ED Provider Notes (Signed)
[facility]-EMERGENCY DEPT Provider Note   CSN: [id number] Arrival date & time: 08/03/19  1308     History Chief Complaint  Patient presents with  . Chest Pain  . Shortness of Breath    Amber Bryan is a 29 y.o. female.  HPI    Patient presents with concern of headache, chest pain, dizziness, dyspnea.  Patient states that she is generally well, but she acknowledges obesity. Since yesterday, without clear precipitant she has had headache across the superior portion of her scalp, with associated chest pain, dizziness, dyspnea.  No fever, no cough, no nausea, vomiting, diarrhea. Since onset symptoms been persistent, no relief with anything. Past Medical History:  Diagnosis Date  . PCOS (polycystic ovarian syndrome)     There are no problems to display for this patient.   History reviewed. No pertinent surgical history.   OB History   No obstetric history on file.     Family History  Problem Relation Age of Onset  . Diabetes Mother   . Hypertension Mother   . Diabetes Father   . Hypertension Father     Social History   Tobacco Use  . Smoking status: Never Smoker  . Smokeless tobacco: Never Used  Substance Use Topics  . Alcohol use: No  . Drug use: No    Home Medications Prior to Admission medications   Medication Sig Start Date End Date Taking? Authorizing Provider  ibuprofen (ADVIL) 200 MG tablet Take 200 mg by mouth every 6 (six) hours as needed for headache or moderate pain.   Yes [provider]  JUNEL FE 1/20 1-20 MG-MCG tablet See admin instructions. 06/25/19  Yes [provider]  meclizine (ANTIVERT) 25 MG tablet Take 1 tablet (25 mg total) by mouth 3 (three) times daily as needed for dizziness. Patient not taking: Reported on 08/03/2019 08/19/16   Trixie Dredge, PA-C  predniSONE (STERAPRED UNI-PAK 21 TAB) 10 MG (21) TBPK tablet Take by mouth daily. Take as directed. Patient not taking: Reported on 08/03/2019 11/28/16    Mardella Layman, MD    Allergies    Patient has no known allergies.  Review of Systems   Review of Systems  Constitutional:       Per HPI, otherwise negative  HENT:       Per HPI, otherwise negative  Respiratory:       Per HPI, otherwise negative  Cardiovascular:       Per HPI, otherwise negative  Gastrointestinal: Negative for vomiting.  Endocrine:       PCOS  Genitourinary:       Neg aside from HPI   Musculoskeletal:       Per HPI, otherwise negative  Skin: Negative.   Neurological: Positive for headaches. Negative for syncope.       Patient has a history of headaches in the distant past, though there different from today's episode.    Physical Exam Updated Vital Signs BP (!) 169/97   Pulse 73   Temp 98.7 F (37.1 C) (Oral)   Resp (!) 21   Ht 5\' 6"  (1.676 m)   Wt (!) 158.8 kg   LMP 07/26/2019   SpO2 100%   BMI 56.49 kg/m   Physical Exam Vitals and nursing note reviewed.  Constitutional:      General: She is not in acute distress.    Appearance: She is well-developed. She is obese. She is not ill-appearing or diaphoretic.  HENT:     Head: Normocephalic and atraumatic.  Eyes:     Conjunctiva/sclera: Conjunctivae normal.  Cardiovascular:     Rate and Rhythm: Normal rate and regular rhythm.  Pulmonary:     Effort: Pulmonary effort is normal. No respiratory distress.     Breath sounds: Normal breath sounds. No stridor.  Abdominal:     General: There is no distension.  Skin:    General: Skin is warm and dry.  Neurological:     Mental Status: She is alert and oriented to person, place, and time.     Cranial Nerves: No cranial nerve deficit.     Motor: No weakness, tremor, atrophy or abnormal muscle tone.     Coordination: Coordination normal.     ED Results / Procedures / Treatments   Labs (all labs ordered are listed, but only abnormal results are displayed) Labs Reviewed  BASIC METABOLIC PANEL - Abnormal; Notable for the following components:       Result Value   Glucose, Bld 103 (*)    All other components within normal limits  CBC - Abnormal; Notable for the following components:   Hemoglobin 11.4 (*)    MCH 25.9 (*)    All other components within normal limits  I-STAT BETA HCG BLOOD, ED ([facility], [facility], [facility] ONLY)  I-STAT BETA HCG BLOOD, ED (NOT ORDERABLE)  TROPONIN I (HIGH SENSITIVITY)  TROPONIN I (HIGH SENSITIVITY)    EKG Sinus rhythm, rate 72, wander, otherwise unremarkable.  Radiology DG Chest 2 View  Result Date: 08/03/2019 CLINICAL DATA:  Chest pain.  Shortness of breath. EXAM: CHEST - 2 VIEW COMPARISON:  No prior. FINDINGS: Mediastinum and hilar structures normal. Lungs are clear. No pleural effusion or pneumothorax. Heart size stable. No acute bony abnormality identified. IMPRESSION: No acute cardiopulmonary disease. Electronically Signed   By: Marcello Moores  Register   On: 08/03/2019 13:50    Procedures Procedures (including critical care time)  Medications Ordered in ED Medications  sodium chloride flush (NS) 0.9 % injection 3 mL (0 mLs Intravenous Hold 08/03/19 1718)  sodium chloride 0.9 % bolus 1,000 mL (1,000 mLs Intravenous New Bag/Given (Non-Interop) 08/03/19 1805)  prochlorperazine (COMPAZINE) injection 10 mg (10 mg Intravenous Given 08/03/19 1806)  ketorolac (TORADOL) 15 MG/ML injection 15 mg (15 mg Intravenous Given 08/03/19 1806)    ED Course  I have reviewed the triage vital signs and the nursing notes.  Pertinent labs & imaging results that were available during my care of the patient were reviewed by me and considered in my medical decision making (see chart for details).   7:57 PM Patient in no distress, awake, alert, feeling better, no ongoing complaints.  We discussed all findings, reassuring, no evidence for atypical ACS, no ongoing neuro complaints, and low suspicion for occult CNS cause.  Some suspicion for atypical migraine, as the patient does have a history of headaches, though not quite the same as today.   No evidence for meningitis, no neck pain, no fever.  Patient amenable to outpatient follow-up, which is reasonable. Final Clinical Impression(s) / ED Diagnoses Final diagnoses:  Atypical chest pain      Carmin Muskrat, MD 08/03/19 [phone]

## 2019-08-03 NOTE — ED Triage Notes (Signed)
Patient c/o intermittent  left chest, dizziness, and SOB since yesterday.

## 2019-08-03 NOTE — Discharge Instructions (Addendum)
As discussed, your evaluation today has been largely reassuring.  But, it is important that you monitor your condition carefully, and do not hesitate to return to the ED if you develop new, or concerning changes in your condition. ? ?Otherwise, please follow-up with your physician for appropriate ongoing care. ? ?

## 2020-05-19 ENCOUNTER — Other Ambulatory Visit (HOSPITAL_COMMUNITY)
Admission: RE | Admit: 2020-05-19 | Discharge: 2020-05-19 | Disposition: A | Discharge status: Home / Self Care | Payer: 59 | Source: Ambulatory Visit | Attending: Family Medicine | Admitting: Family Medicine

## 2020-05-19 ENCOUNTER — Emergency Department (INDEPENDENT_AMBULATORY_CARE_PROVIDER_SITE_OTHER)
Admission: RE | Admit: 2020-05-19 | Discharge: 2020-05-19 | Disposition: A | Discharge status: Home / Self Care | Payer: 59 | Source: Ambulatory Visit | Attending: Family Medicine | Admitting: Family Medicine

## 2020-05-19 ENCOUNTER — Encounter (HOSPITAL_COMMUNITY): Payer: Self-pay

## 2020-05-19 ENCOUNTER — Other Ambulatory Visit: Payer: Self-pay

## 2020-05-19 VITALS — BP 151/90 | HR 76 | Temp 98.9°F | Resp 18 | Ht 67.0 in | Wt 353.0 lb

## 2020-05-19 DIAGNOSIS — N390 Urinary tract infection, site not specified: Secondary | ICD-10-CM

## 2020-05-19 DIAGNOSIS — R030 Elevated blood-pressure reading, without diagnosis of hypertension: Secondary | ICD-10-CM

## 2020-05-19 DIAGNOSIS — Z202 Contact with and (suspected) exposure to infections with a predominantly sexual mode of transmission: Secondary | ICD-10-CM | POA: Insufficient documentation

## 2020-05-19 DIAGNOSIS — N760 Acute vaginitis: Secondary | ICD-10-CM | POA: Diagnosis not present

## 2020-05-19 DIAGNOSIS — A5901 Trichomonal vulvovaginitis: Secondary | ICD-10-CM | POA: Diagnosis not present

## 2020-05-19 LAB — POCT URINALYSIS DIP (MANUAL ENTRY)
Bilirubin, UA: NEGATIVE
Glucose, UA: NEGATIVE mg/dL
Ketones, POC UA: NEGATIVE mg/dL
Nitrite, UA: NEGATIVE
Protein Ur, POC: NEGATIVE mg/dL
Spec Grav, UA: 1.02 (ref 1.010–1.025)
Urobilinogen, UA: 1 E.U./dL
pH, UA: 7 (ref 5.0–8.0)

## 2020-05-19 LAB — POCT URINE PREGNANCY: Preg Test, Ur: NEGATIVE

## 2020-05-19 MED ORDER — NITROFURANTOIN MONOHYD MACRO 100 MG PO CAPS: 100.0000 mg | ORAL_CAPSULE | Freq: Two times a day (BID) | ORAL | 0 refills | Status: DC

## 2020-05-19 NOTE — Discharge Instructions (Signed)
Drink lots of water Take the antibiotic 2 x a day You will be called if any of your tests are positive

## 2020-05-19 NOTE — ED Provider Notes (Signed)
[facility]    CSN: [id number] Arrival date & time: 05/19/20  1925      History   Chief Complaint Chief Complaint  Patient presents with  . Dysuria    HPI Amber Bryan is a 30 y.o. female.   HPI   Patient has dysuria and wishes to be checked for UTI Also wants testing for STD No fever or chills.  No abdominal pain.  NO NVD  Past Medical History:  Diagnosis Date  . PCOS (polycystic ovarian syndrome)     There are no problems to display for this patient.   History reviewed. No pertinent surgical history.  OB History   No obstetric history on file.      Home Medications    Prior to Admission medications   Medication Sig Start Date End Date Taking? Authorizing Provider  nitrofurantoin, macrocrystal-monohydrate, (MACROBID) 100 MG capsule Take 1 capsule (100 mg total) by mouth 2 (two) times daily. 05/19/20  Yes Eustace Moore, MD  ibuprofen (ADVIL) 200 MG tablet Take 200 mg by mouth every 6 (six) hours as needed for headache or moderate pain.    [provider]  JUNEL FE 1/20 1-20 MG-MCG tablet See admin instructions. 06/25/19   [provider]    Family History Family History  Problem Relation Age of Onset  . Diabetes Mother   . Hypertension Mother   . Diabetes Father   . Hypertension Father     Social History Social History   Tobacco Use  . Smoking status: Never Smoker  . Smokeless tobacco: Never Used  Vaping Use  . Vaping Use: Never used  Substance Use Topics  . Alcohol use: Yes  . Drug use: No     Allergies   Patient has no known allergies.   Review of Systems Review of Systems See HPI  Physical Exam Triage Vital Signs ED Triage Vitals  Enc Vitals Group     BP 05/19/20 1946 (!) 151/90     Pulse Rate 05/19/20 1946 76     Resp 05/19/20 1946 18     Temp 05/19/20 1946 98.9 F (37.2 C)     Temp Source 05/19/20 1946 Oral     SpO2 05/19/20 1946 98 %     Weight 05/19/20 1947 (!) 353 lb (160.1 kg)      Height 05/19/20 1947 5\' 7"  (1.702 m)     Head Circumference --      Peak Flow --      Pain Score 05/19/20 1947 2     Pain Loc --      Pain Edu? --      Excl. in GC? --    No data found.  Updated Vital Signs BP (!) 151/90 (BP Location: Right Arm)   Pulse 76   Temp 98.9 F (37.2 C) (Oral)   Resp 18   Ht 5\' 7"  (1.702 m)   Wt (!) 160.1 kg   SpO2 98%   BMI 55.29 kg/m     Physical Exam Constitutional:      General: She is not in acute distress.    Appearance: She is well-developed and well-nourished. She is obese.  HENT:     Head: Normocephalic and atraumatic.     Mouth/Throat:     Mouth: Oropharynx is clear and moist.  Eyes:     Conjunctiva/sclera: Conjunctivae normal.     Pupils: Pupils are equal, round, and reactive to light.  Cardiovascular:     Rate and Rhythm: Normal  rate.  Pulmonary:     Effort: Pulmonary effort is normal. No respiratory distress.  Abdominal:     Palpations: Abdomen is soft.     Tenderness: There is no right CVA tenderness or left CVA tenderness.  Musculoskeletal:        General: No edema. Normal range of motion.     Cervical back: Normal range of motion.  Skin:    General: Skin is warm and dry.  Neurological:     General: No focal deficit present.     Mental Status: She is alert.  Psychiatric:        Behavior: Behavior normal.      UC Treatments / Results  Labs (all labs ordered are listed, but only abnormal results are displayed) Labs Reviewed  URINE CULTURE  POCT URINALYSIS DIP (MANUAL ENTRY)  POCT URINE PREGNANCY  CERVICOVAGINAL ANCILLARY ONLY    EKG   Radiology No results found.   Procedures Procedures (including critical care time)  Medications Ordered in UC Medications - No data to display  Initial Impression / Assessment and Plan / UC Course  I have reviewed the triage vital signs and the nursing notes.  Pertinent labs & imaging results that were available during my care of the patient were reviewed by me and  considered in my medical decision making (see chart for details).     Final Clinical Impressions(s) / UC Diagnoses   Final diagnoses:  Lower urinary tract infectious disease  Possible exposure to STD  Elevated blood pressure reading     Discharge Instructions     Drink lots of water Take the antibiotic 2 x a day You will be called if any of your tests are positive     ED Prescriptions    Medication Sig Dispense Auth. Provider   nitrofurantoin, macrocrystal-monohydrate, (MACROBID) 100 MG capsule Take 1 capsule (100 mg total) by mouth 2 (two) times daily. 10 capsule Eustace Moore, MD     PDMP not reviewed this encounter.   Eustace Moore, MD 05/19/20 2002

## 2020-05-19 NOTE — ED Triage Notes (Signed)
Dysuria Condom came off during sex, wants GC, basic

## 2020-05-21 LAB — URINE CULTURE
MICRO NUMBER:: 11396349
SPECIMEN QUALITY:: ADEQUATE

## 2020-05-23 ENCOUNTER — Telehealth (HOSPITAL_COMMUNITY): Payer: Self-pay

## 2020-05-23 LAB — CERVICOVAGINAL ANCILLARY ONLY
Bacterial Vaginitis (gardnerella): POSITIVE — AB
Candida Glabrata: NEGATIVE
Candida Vaginitis: NEGATIVE
Chlamydia: NEGATIVE
Comment: NEGATIVE
Comment: NEGATIVE
Comment: NEGATIVE
Comment: NEGATIVE
Comment: NEGATIVE
Comment: NORMAL
Neisseria Gonorrhea: NEGATIVE
Trichomonas: POSITIVE — AB

## 2020-05-23 MED ORDER — METRONIDAZOLE 500 MG PO TABS: 500.0000 mg | ORAL_TABLET | Freq: Two times a day (BID) | ORAL | 0 refills | Status: DC

## 2020-05-23 NOTE — Telephone Encounter (Signed)
-----   Message from Maebelle Munroe, [place] sent at 05/23/2020  3:48 PM EST ----- Pt called re: tx for trich and bv. Is currently taking macrobid for UTI. Please advise.

## 2020-06-06 ENCOUNTER — Encounter: Payer: Self-pay | Admitting: Emergency Medicine

## 2020-06-06 ENCOUNTER — Ambulatory Visit
Admission: EM | Admit: 2020-06-06 | Discharge: 2020-06-06 | Disposition: A | Discharge status: Home / Self Care | Payer: 59 | Attending: Emergency Medicine | Admitting: Emergency Medicine

## 2020-06-06 ENCOUNTER — Other Ambulatory Visit: Payer: Self-pay

## 2020-06-06 ENCOUNTER — Ambulatory Visit: Payer: Self-pay

## 2020-06-06 DIAGNOSIS — Z7251 High risk heterosexual behavior: Secondary | ICD-10-CM | POA: Diagnosis not present

## 2020-06-06 DIAGNOSIS — Z113 Encounter for screening for infections with a predominantly sexual mode of transmission: Secondary | ICD-10-CM | POA: Diagnosis not present

## 2020-06-06 NOTE — ED Provider Notes (Signed)
[facility]    CSN: [id number] Arrival date & time: 06/06/20  1401      History   Chief Complaint Chief Complaint  Patient presents with  . SEXUALLY TRANSMITTED DISEASE    HPI Amber Bryan is a 30 y.o. female  With history as below presenting for STI testing.  Patient has not had coitus since previous testing on 05/23/2020, during which she tested positive for trichomonas and BV.  Completed Flagyl, no longer having symptoms.  Past Medical History:  Diagnosis Date  . PCOS (polycystic ovarian syndrome)     There are no problems to display for this patient.   History reviewed. No pertinent surgical history.  OB History   No obstetric history on file.      Home Medications    Prior to Admission medications   Medication Sig Start Date End Date Taking? Authorizing Provider  ibuprofen (ADVIL) 200 MG tablet Take 200 mg by mouth every 6 (six) hours as needed for headache or moderate pain.    [provider]  JUNEL FE 1/20 1-20 MG-MCG tablet See admin instructions. 06/25/19   [provider]    Family History Family History  Problem Relation Age of Onset  . Diabetes Mother   . Hypertension Mother   . Diabetes Father   . Hypertension Father     Social History Social History   Tobacco Use  . Smoking status: Never Smoker  . Smokeless tobacco: Never Used  Vaping Use  . Vaping Use: Never used  Substance Use Topics  . Alcohol use: Yes  . Drug use: No     Allergies   Patient has no known allergies.   Review of Systems Review of Systems  Constitutional: Negative for fatigue and fever.  Respiratory: Negative for cough and shortness of breath.   Cardiovascular: Negative for chest pain and palpitations.  Gastrointestinal: Negative for constipation and diarrhea.  Genitourinary: Negative for dysuria, flank pain, frequency, hematuria, pelvic pain, urgency, vaginal bleeding, vaginal discharge and vaginal pain.     Physical  Exam Triage Vital Signs ED Triage Vitals  Enc Vitals Group     BP      Pulse      Resp      Temp      Temp src      SpO2      Weight      Height      Head Circumference      Peak Flow      Pain Score      Pain Loc      Pain Edu?      Excl. in GC?    No data found.  Updated Vital Signs BP (!) 142/88 (BP Location: Left Arm)   Pulse 87   Temp 98.3 F (36.8 C) (Oral)   Resp 18   LMP 03/06/2020   SpO2 98%   Visual Acuity Right Eye Distance:   Left Eye Distance:   Bilateral Distance:    Right Eye Near:   Left Eye Near:    Bilateral Near:     Physical Exam Constitutional:      General: She is not in acute distress. HENT:     Head: Normocephalic and atraumatic.  Eyes:     General: No scleral icterus.    Pupils: Pupils are equal, round, and reactive to light.  Cardiovascular:     Rate and Rhythm: Normal rate.  Pulmonary:     Effort: Pulmonary effort is normal.  Abdominal:     General: Bowel sounds are normal.     Palpations: Abdomen is soft.     Tenderness: There is no abdominal tenderness. There is no right CVA tenderness, left CVA tenderness or guarding.  Genitourinary:    Comments: Patient declined, self-swab performed Skin:    Coloration: Skin is not jaundiced or pale.  Neurological:     Mental Status: She is alert and oriented to person, place, and time.      UC Treatments / Results  Labs (all labs ordered are listed, but only abnormal results are displayed) Labs Reviewed  CERVICOVAGINAL ANCILLARY ONLY    EKG   Radiology No results found.  Procedures Procedures (including critical care time)  Medications Ordered in UC Medications - No data to display  Initial Impression / Assessment and Plan / UC Course  I have reviewed the triage vital signs and the nursing notes.  Pertinent labs & imaging results that were available during my care of the patient were reviewed by me and considered in my medical decision making (see chart for  details).     Discussed risk of positive false negative given recent treatment.  Patient requesting testing-pending.  Return precautions discussed, pt verbalized understanding and is agreeable to plan. Final Clinical Impressions(s) / UC Diagnoses   Final diagnoses:  Screening examination for venereal disease  Unprotected sex     Discharge Instructions     Testing for chlamydia, gonorrhea, trichomonas is pending: please look for these results on the MyChart app/website.  We will notify you if you are positive and outline treatment at that time.  Important to avoid all forms of sexual intercourse (oral, vaginal, anal) with any/all partners for the next 7 days to avoid spreading/reinfecting. Any/all sexual partners should be notified of testing/treatment today.  Return for persistent/worsening symptoms or if you develop fever, abdominal or pelvic pain, discharge, genital pain, blood in your urine, or are re-exposed to an STI.    ED Prescriptions    None     PDMP not reviewed this encounter.   Hall[place]Potvin, [place], [place] 06/06/20 1521

## 2020-06-06 NOTE — Discharge Instructions (Signed)

## 2020-06-06 NOTE — ED Triage Notes (Signed)
Pt states that she is here for STD checked. Pt denies any sx.

## 2020-06-07 LAB — CERVICOVAGINAL ANCILLARY ONLY
Bacterial Vaginitis (gardnerella): POSITIVE — AB
Candida Glabrata: NEGATIVE
Candida Vaginitis: NEGATIVE
Chlamydia: NEGATIVE
Comment: NEGATIVE
Comment: NEGATIVE
Comment: NEGATIVE
Comment: NEGATIVE
Comment: NEGATIVE
Comment: NORMAL
Neisseria Gonorrhea: NEGATIVE
Trichomonas: POSITIVE — AB

## 2020-06-20 ENCOUNTER — Ambulatory Visit
Admission: EM | Admit: 2020-06-20 | Discharge: 2020-06-20 | Disposition: A | Discharge status: Home / Self Care | Payer: 59 | Attending: Internal Medicine | Admitting: Internal Medicine

## 2020-06-20 ENCOUNTER — Other Ambulatory Visit: Payer: Self-pay

## 2020-06-20 DIAGNOSIS — Z113 Encounter for screening for infections with a predominantly sexual mode of transmission: Secondary | ICD-10-CM | POA: Diagnosis present

## 2020-06-20 NOTE — ED Provider Notes (Signed)
[facility]    CSN: [id number] Arrival date & time: 06/20/20  1810      History   Chief Complaint Chief Complaint  Patient presents with  . SEXUALLY TRANSMITTED DISEASE    HPI Amber Bryan is a 30 y.o. female who presents requesting STD testing. She had trich one month ago and feels her symptoms have resolved.  Denies symptoms, last sexual encounter was [facility] when she was diagnosed with trich    Past Medical History:  Diagnosis Date  . PCOS (polycystic ovarian syndrome)     There are no problems to display for this patient.   History reviewed. No pertinent surgical history.  OB History   No obstetric history on file.      Home Medications    Prior to Admission medications   Medication Sig Start Date End Date Taking? Authorizing Provider  metroNIDAZOLE (FLAGYL) 500 MG tablet Take 1 tablet (500 mg total) by mouth 2 (two) times daily. 06/22/20   LampteyBritta Mccreedy, MD    Family History Family History  Problem Relation Age of Onset  . Diabetes Mother   . Hypertension Mother   . Diabetes Father   . Hypertension Father     Social History Social History   Tobacco Use  . Smoking status: Never Smoker  . Smokeless tobacco: Never Used  Vaping Use  . Vaping Use: Never used  Substance Use Topics  . Alcohol use: Yes  . Drug use: No     Allergies   Patient has no known allergies.   Review of Systems Review of Systems  All other systems reviewed and are negative.    Physical Exam Triage Vital Signs ED Triage Vitals  Enc Vitals Group     BP 06/20/20 1845 (!) 155/87     Pulse Rate 06/20/20 1845 95     Resp 06/20/20 1845 18     Temp 06/20/20 1845 99.6 F (37.6 C)     Temp Source 06/20/20 1845 Oral     SpO2 06/20/20 1845 98 %     Weight --      Height --      Head Circumference --      Peak Flow --      Pain Score 06/20/20 1846 0     Pain Loc --      Pain Edu? --      Excl. in GC? --    No data found.  Updated Vital  Signs BP (!) 155/87 (BP Location: Left Arm)   Pulse 95   Temp 99.6 F (37.6 C) (Oral)   Resp 18   SpO2 98%   Visual Acuity Right Eye Distance:   Left Eye Distance:   Bilateral Distance:    Right Eye Near:   Left Eye Near:    Bilateral Near:     Physical Exam Vitals and nursing note reviewed.  Constitutional:      General: She is not in acute distress.    Appearance: She is obese. She is not toxic-appearing.  HENT:     Right Ear: External ear normal.     Left Ear: External ear normal.  Eyes:     General: No scleral icterus.    Conjunctiva/sclera: Conjunctivae normal.  Pulmonary:     Effort: Pulmonary effort is normal.  Musculoskeletal:        General: Normal range of motion.     Cervical back: Neck supple.  Neurological:     Mental Status: She is  alert and oriented to person, place, and time.     Gait: Gait normal.  Psychiatric:        Mood and Affect: Mood normal.        Behavior: Behavior normal.        Thought Content: Thought content normal.        Judgment: Judgment normal.      UC Treatments / Results  Labs (all labs ordered are listed, but only abnormal results are displayed) Labs Reviewed  CERVICOVAGINAL ANCILLARY ONLY - Abnormal; Notable for the following components:      Result Value   Bacterial Vaginitis (gardnerella) Positive (*)    Trichomonas Positive (*)    All other components within normal limits  HIV ANTIBODY (ROUTINE TESTING W REFLEX)   Narrative:    Performed at:  [facility] [address], [place], [place]  [id number] Lab Director: Jolene Schimke MD, Phone:  [phone]  [facility]PR   Narrative:    Performed at:  [address] [place] [address] [place], [place]  [id number] Lab Director: Jolene Schimke MD, Phone:  [phone]    EKG   Radiology No results found.  Procedures Procedures (including critical care time)  Medications Ordered in UC Medications - No data to display  Initial Impression / Assessment and  Plan / UC Course  I have reviewed the triage vital signs and the nursing notes. Vaginal swab is pending and we will inform her if the test come back positive, she will check on mychart for results    Final Clinical Impressions(s) / UC Diagnoses   Final diagnoses:  Screening examination for STD (sexually transmitted disease)     Discharge Instructions     Check on your mychart for your results.     ED Prescriptions    None     PDMP not reviewed this encounter.   Garey Ham, [place] 06/23/20 [phone]

## 2020-06-20 NOTE — Discharge Instructions (Signed)
Check on your mychart for your results.

## 2020-06-20 NOTE — ED Triage Notes (Signed)
Pt states had trich a month ago. Requesting full std testing. Denies sx's

## 2020-06-21 LAB — RPR: RPR Ser Ql: NONREACTIVE

## 2020-06-21 LAB — HIV ANTIBODY (ROUTINE TESTING W REFLEX): HIV Screen 4th Generation wRfx: NONREACTIVE

## 2020-06-22 ENCOUNTER — Telehealth (HOSPITAL_COMMUNITY): Payer: Self-pay | Admitting: Emergency Medicine

## 2020-06-22 LAB — CERVICOVAGINAL ANCILLARY ONLY
Bacterial Vaginitis (gardnerella): POSITIVE — AB
Candida Glabrata: NEGATIVE
Candida Vaginitis: NEGATIVE
Chlamydia: NEGATIVE
Comment: NEGATIVE
Comment: NEGATIVE
Comment: NEGATIVE
Comment: NEGATIVE
Comment: NEGATIVE
Comment: NORMAL
Neisseria Gonorrhea: NEGATIVE
Trichomonas: POSITIVE — AB

## 2020-06-22 MED ORDER — METRONIDAZOLE 500 MG PO TABS: 500.0000 mg | ORAL_TABLET | Freq: Two times a day (BID) | ORAL | 0 refills | Status: DC

## 2020-07-17 ENCOUNTER — Emergency Department
Admission: RE | Admit: 2020-07-17 | Discharge: 2020-07-17 | Disposition: A | Discharge status: Home / Self Care | Payer: 59 | Source: Ambulatory Visit

## 2020-07-17 ENCOUNTER — Other Ambulatory Visit (HOSPITAL_COMMUNITY)
Admission: RE | Admit: 2020-07-17 | Discharge: 2020-07-17 | Disposition: A | Discharge status: Home / Self Care | Payer: 59 | Source: Ambulatory Visit | Attending: Family Medicine | Admitting: Family Medicine

## 2020-07-17 ENCOUNTER — Other Ambulatory Visit: Payer: Self-pay

## 2020-07-17 ENCOUNTER — Emergency Department: Admit: 2020-07-17 | Discharge status: Absent without leave | Payer: Self-pay

## 2020-07-17 VITALS — BP 130/83 | HR 85 | Temp 99.0°F | Resp 16

## 2020-07-17 DIAGNOSIS — Z113 Encounter for screening for infections with a predominantly sexual mode of transmission: Secondary | ICD-10-CM | POA: Insufficient documentation

## 2020-07-17 DIAGNOSIS — Z202 Contact with and (suspected) exposure to infections with a predominantly sexual mode of transmission: Secondary | ICD-10-CM

## 2020-07-17 DIAGNOSIS — R197 Diarrhea, unspecified: Secondary | ICD-10-CM

## 2020-07-17 NOTE — Discharge Instructions (Signed)
May take ibuprofen or Tylenol for pain May use Pepto-Bismol or Imodium for loose bowels Check your for the swab result on MyChart

## 2020-07-17 NOTE — ED Provider Notes (Signed)
[facility]    CSN: [id number] Arrival date & time: 07/17/20  1329      History   Chief Complaint Chief Complaint  Patient presents with  . Abdominal Pain    HPI Amber Bryan is a 30 y.o. female.   HPI   Patient had a positive test for trichomonas on 2 different occasions.  She slept with the same gentleman again, and thinks he was treated but is not certain.  She did use a condom.  She would like retesting.  She is having no symptoms, vaginal discharge, irritation, or odor She states that she does have some diarrhea.  She thinks it is from her Junel.  She did start back on her birth control pills.  She was told it might cause some stomach upset.  She is not having watery diarrhea, dehydration, abdominal pain, or bloody bowels.  Past Medical History:  Diagnosis Date  . PCOS (polycystic ovarian syndrome)     There are no problems to display for this patient.   History reviewed. No pertinent surgical history.  OB History   No obstetric history on file.      Home Medications    Prior to Admission medications   Medication Sig Start Date End Date Taking? Authorizing Provider  norethindrone-ethinyl estradiol (LOESTRIN FE) 1-20 MG-MCG tablet Take 1 tablet by mouth daily.   Yes [provider]  metroNIDAZOLE (FLAGYL) 500 MG tablet Take 1 tablet (500 mg total) by mouth 2 (two) times daily. 06/22/20   LampteyBritta Mccreedy, MD    Family History Family History  Problem Relation Age of Onset  . Diabetes Mother   . Hypertension Mother   . Diabetes Father   . Hypertension Father     Social History Social History   Tobacco Use  . Smoking status: Never Smoker  . Smokeless tobacco: Never Used  Vaping Use  . Vaping Use: Never used  Substance Use Topics  . Alcohol use: Yes    Comment: occ  . Drug use: No     Allergies   Patient has no known allergies.   Review of Systems Review of Systems See HPI  Physical Exam Triage Vital Signs ED  Triage Vitals  Enc Vitals Group     BP 07/17/20 1340 130/83     Pulse Rate 07/17/20 1340 85     Resp 07/17/20 1340 16     Temp 07/17/20 1340 99 F (37.2 C)     Temp Source 07/17/20 1340 Oral     SpO2 07/17/20 1340 99 %     Weight --      Height --      Head Circumference --      Peak Flow --      Pain Score 07/17/20 1337 5     Pain Loc --      Pain Edu? --      Excl. in GC? --    No data found.  Updated Vital Signs BP 130/83 (BP Location: Right Arm)   Pulse 85   Temp 99 F (37.2 C) (Oral)   Resp 16   SpO2 99%      Physical Exam Constitutional:      General: She is not in acute distress.    Appearance: She is well-developed and well-nourished. She is obese.  HENT:     Head: Normocephalic and atraumatic.     Mouth/Throat:     Mouth: Oropharynx is clear and moist.  Eyes:  Conjunctiva/sclera: Conjunctivae normal.     Pupils: Pupils are equal, round, and reactive to light.  Cardiovascular:     Rate and Rhythm: Normal rate.  Pulmonary:     Effort: Pulmonary effort is normal. No respiratory distress.  Abdominal:     General: There is no distension.     Palpations: Abdomen is soft.     Comments: Abdomen is soft and nontender.  No CVAT  Musculoskeletal:        General: No edema. Normal range of motion.     Cervical back: Normal range of motion.  Skin:    General: Skin is warm and dry.  Neurological:     Mental Status: She is alert.  Psychiatric:        Behavior: Behavior normal.      UC Treatments / Results  Labs (all labs ordered are listed, but only abnormal results are displayed) Labs Reviewed  CERVICOVAGINAL ANCILLARY ONLY    EKG   Radiology No results found.  Procedures Procedures (including critical care time)  Medications Ordered in UC Medications - No data to display  Initial Impression / Assessment and Plan / UC Course  I have reviewed the triage vital signs and the nursing notes.  Pertinent labs & imaging results that were  available during my care of the patient were reviewed by me and considered in my medical decision making (see chart for details).    Final Clinical Impressions(s) / UC Diagnoses   Final diagnoses:  Exposure to STD  Diarrhea, unspecified type     Discharge Instructions     May take ibuprofen or Tylenol for pain May use Pepto-Bismol or Imodium for loose bowels Check your for the swab result on MyChart     ED Prescriptions    None     PDMP not reviewed this encounter.   Eustace Moore, MD 07/17/20 [phone]

## 2020-07-17 NOTE — ED Triage Notes (Signed)
Patient presents to Urgent Care with complaints of generalized abdominal pain and bloating feeling since about 2 days ago. Patient reports she just restarted Junel for birth control and is not sure if the symptoms are related, has also been having frequent loose BMs.  Had trichomonas last month, for which she was treated.

## 2020-07-18 LAB — CERVICOVAGINAL ANCILLARY ONLY
Chlamydia: NEGATIVE
Comment: NEGATIVE
Comment: NEGATIVE
Comment: NORMAL
Neisseria Gonorrhea: NEGATIVE
Trichomonas: NEGATIVE

## 2021-12-12 ENCOUNTER — Ambulatory Visit
Admission: RE | Admit: 2021-12-12 | Discharge: 2021-12-12 | Disposition: A | Discharge status: Home / Self Care | Payer: Self-pay | Source: Ambulatory Visit

## 2021-12-12 VITALS — BP 127/84 | HR 89 | Temp 98.0°F | Resp 18

## 2021-12-12 DIAGNOSIS — N390 Urinary tract infection, site not specified: Secondary | ICD-10-CM | POA: Insufficient documentation

## 2021-12-12 DIAGNOSIS — R3915 Urgency of urination: Secondary | ICD-10-CM | POA: Insufficient documentation

## 2021-12-12 DIAGNOSIS — Z113 Encounter for screening for infections with a predominantly sexual mode of transmission: Secondary | ICD-10-CM | POA: Insufficient documentation

## 2021-12-12 LAB — POCT URINALYSIS DIP (MANUAL ENTRY)
Bilirubin, UA: NEGATIVE
Blood, UA: NEGATIVE
Glucose, UA: NEGATIVE mg/dL
Nitrite, UA: NEGATIVE
Protein Ur, POC: 30 mg/dL — AB
Spec Grav, UA: >=1.03 — AB (ref 1.010–1.025)
Urobilinogen, UA: 2 E.U./dL — AB
pH, UA: 6 (ref 5.0–8.0)

## 2021-12-12 MED ORDER — AMOXICILLIN-POT CLAVULANATE 875-125 MG PO TABS: 1.0000 | ORAL_TABLET | Freq: Two times a day (BID) | ORAL | 0 refills | Status: AC

## 2021-12-12 NOTE — ED Triage Notes (Signed)
Pt here to make sure her UTI has resolved. States still having retention.

## 2021-12-12 NOTE — ED Provider Notes (Addendum)
[facility]    CSN: [id number] Arrival date & time: 12/12/21  1448      History   Chief Complaint Chief Complaint  Patient presents with   Urinary Tract Infection    HPI Amber Bryan is a 31 y.o. female.   Patient presents with urinary urgency and a feeling of not being able to completely empty her bladder.  Patient was treated for urinary tract infection with antibiotic approximately 2 weeks ago by healthcare provider in [place], [place].  She reports symptoms resolved, but returned about 3 days ago.  Denies any associated urinary burning, hematuria, vaginal discharge, abdominal pain, back pain, fever.  Denies any concern for STD.  Last menstrual cycle was about 2 weeks ago as well.  She is not sure the name of the medication that she took for urinary tract infection.   Urinary Tract Infection   Past Medical History:  Diagnosis Date   PCOS (polycystic ovarian syndrome)     There are no problems to display for this patient.   History reviewed. No pertinent surgical history.  OB History   No obstetric history on file.      Home Medications    Prior to Admission medications   Medication Sig Start Date End Date Taking? Authorizing Provider  amoxicillin-clavulanate (AUGMENTIN) 875-125 MG tablet Take 1 tablet by mouth every 12 (twelve) hours. 12/12/21  Yes Yosgar Demirjian, Rolly Salter E, FNP  metFORMIN (GLUCOPHAGE) 1000 MG tablet Take by mouth. 10/09/21  Yes [provider]  norethindrone-ethinyl estradiol (LOESTRIN FE) 1-20 MG-MCG tablet Take 1 tablet by mouth daily.    [provider]    Family History Family History  Problem Relation Age of Onset   Diabetes Mother    Hypertension Mother    Diabetes Father    Hypertension Father     Social History Social History   Tobacco Use   Smoking status: Never   Smokeless tobacco: Never  Vaping Use   Vaping Use: Never used  Substance Use Topics   Alcohol use: Yes    Comment: occ   Drug  use: No     Allergies   Patient has no known allergies.   Review of Systems Review of Systems Per HPI  Physical Exam Triage Vital Signs ED Triage Vitals [12/12/21 1522]  Enc Vitals Group     BP 127/84     Pulse Rate 89     Resp 18     Temp 98 F (36.7 C)     Temp Source Oral     SpO2 97 %     Weight      Height      Head Circumference      Peak Flow      Pain Score 0     Pain Loc      Pain Edu?      Excl. in GC?    No data found.  Updated Vital Signs BP 127/84 (BP Location: Right Arm)   Pulse 89   Temp 98 F (36.7 C) (Oral)   Resp 18   SpO2 97%   Visual Acuity Right Eye Distance:   Left Eye Distance:   Bilateral Distance:    Right Eye Near:   Left Eye Near:    Bilateral Near:     Physical Exam Constitutional:      General: She is not in acute distress.    Appearance: Normal appearance. She is not toxic-appearing or diaphoretic.  HENT:     Head:  Normocephalic and atraumatic.  Eyes:     Extraocular Movements: Extraocular movements intact.     Conjunctiva/sclera: Conjunctivae normal.  Cardiovascular:     Rate and Rhythm: Normal rate and regular rhythm.     Pulses: Normal pulses.     Heart sounds: Normal heart sounds.  Pulmonary:     Effort: Pulmonary effort is normal. No respiratory distress.     Breath sounds: Normal breath sounds.  Abdominal:     General: Bowel sounds are normal. There is no distension.     Palpations: Abdomen is soft.     Tenderness: There is no abdominal tenderness.  Genitourinary:    Comments: Deferred with shared decision making.  Self swab performed. Neurological:     General: No focal deficit present.     Mental Status: She is alert and oriented to person, place, and time. Mental status is at baseline.  Psychiatric:        Mood and Affect: Mood normal.        Behavior: Behavior normal.        Thought Content: Thought content normal.        Judgment: Judgment normal.      UC Treatments / Results  Labs (all  labs ordered are listed, but only abnormal results are displayed) Labs Reviewed  POCT URINALYSIS DIP (MANUAL ENTRY) - Abnormal; Notable for the following components:      Result Value   Ketones, POC UA trace (5) (*)    Spec Grav, UA >=1.030 (*)    Protein Ur, POC =30 (*)    Urobilinogen, UA 2.0 (*)    Leukocytes, UA Trace (*)    All other components within normal limits  URINE CULTURE  CERVICOVAGINAL ANCILLARY ONLY    EKG   Radiology No results found.  Procedures Procedures (including critical care time)  Medications Ordered in UC Medications - No data to display  Initial Impression / Assessment and Plan / UC Course  I have reviewed the triage vital signs and the nursing notes.  Pertinent labs & imaging results that were available during my care of the patient were reviewed by me and considered in my medical decision making (see chart for details).     Patient has trace leukocytes which is not definitive for urinary tract infection but given recent UTI and persistent symptoms, will opt to treat with antibiotic.  Attempted to call patient's pharmacy to determine what antibiotic she took about 2 weeks ago but they would not answer.  Will treat with Augmentin given unable to determine what antibiotic patient took.  Urine culture is pending.  Cervicovaginal swab also pending to rule out other etiologies.  Discussed return precautions.  Patient verbalized understanding and was agreeable with plan.  Suspect other abnormalities on UA due to low p.o. intake versus UTI. Final Clinical Impressions(s) / UC Diagnoses   Final diagnoses:  Lower urinary tract infection  Urinary urgency  Screening examination for venereal disease     Discharge Instructions      You have been sent Augmentin antibiotic to help alleviate urinary tract infection.  Urine culture and vaginal swab are pending.  We will call if results are abnormal.  Please follow-up if symptoms persist or worsen.    ED  Prescriptions     Medication Sig Dispense Auth. Provider   amoxicillin-clavulanate (AUGMENTIN) 875-125 MG tablet Take 1 tablet by mouth every 12 (twelve) hours. 14 tablet [place], Michele Rockers, [place]      PDMP not reviewed this encounter.  Gustavus Bryant, [place] 12/12/21 1640    Gustavus Bryant, [place] 12/12/21 [phone]

## 2021-12-12 NOTE — Discharge Instructions (Signed)
You have been sent Augmentin antibiotic to help alleviate urinary tract infection.  Urine culture and vaginal swab are pending.  We will call if results are abnormal.  Please follow-up if symptoms persist or worsen.

## 2021-12-13 LAB — URINE CULTURE

## 2021-12-13 LAB — CERVICOVAGINAL ANCILLARY ONLY
Bacterial Vaginitis (gardnerella): POSITIVE — AB
Candida Glabrata: NEGATIVE
Candida Vaginitis: NEGATIVE
Chlamydia: NEGATIVE
Comment: NEGATIVE
Comment: NEGATIVE
Comment: NEGATIVE
Comment: NEGATIVE
Comment: NEGATIVE
Comment: NORMAL
Neisseria Gonorrhea: NEGATIVE
Trichomonas: NEGATIVE

## 2021-12-16 ENCOUNTER — Telehealth (HOSPITAL_COMMUNITY): Payer: Self-pay | Admitting: Emergency Medicine

## 2021-12-16 MED ORDER — METRONIDAZOLE 500 MG PO TABS: 500.0000 mg | ORAL_TABLET | Freq: Two times a day (BID) | ORAL | 0 refills | Status: AC
